# Patient Record
Sex: Male | Born: 2000 | Race: Black or African American | Hispanic: No | Marital: Single
Health system: Southern US, Community
[De-identification: ages and names within clinical notes are randomized; demographics above are authoritative.]

## PROBLEM LIST (undated history)

## (undated) DIAGNOSIS — F909 Attention-deficit hyperactivity disorder, unspecified type: Secondary | ICD-10-CM

## (undated) DIAGNOSIS — J302 Other seasonal allergic rhinitis: Secondary | ICD-10-CM

---
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
## 2020-03-28 ENCOUNTER — Emergency Department (HOSPITAL_COMMUNITY)
Admission: EM | Admit: 2020-03-28 | Discharge: 2020-03-28 | Disposition: A | Discharge status: Home / Self Care | Payer: Medicaid Other | Attending: Emergency Medicine | Admitting: Emergency Medicine

## 2020-03-28 ENCOUNTER — Encounter (HOSPITAL_COMMUNITY): Payer: Self-pay | Admitting: Emergency Medicine

## 2020-03-28 DIAGNOSIS — R21 Rash and other nonspecific skin eruption: Secondary | ICD-10-CM | POA: Diagnosis present

## 2020-03-28 DIAGNOSIS — R5381 Other malaise: Secondary | ICD-10-CM | POA: Diagnosis not present

## 2020-03-28 DIAGNOSIS — L509 Urticaria, unspecified: Secondary | ICD-10-CM | POA: Diagnosis not present

## 2020-03-28 DIAGNOSIS — Z7722 Contact with and (suspected) exposure to environmental tobacco smoke (acute) (chronic): Secondary | ICD-10-CM | POA: Diagnosis not present

## 2020-03-28 MED ORDER — SODIUM CHLORIDE 0.9 % IV BOLUS: 500.0000 mL | Freq: Once | INTRAVENOUS | Status: DC

## 2020-03-28 MED ORDER — DIPHENHYDRAMINE HCL 25 MG PO CAPS
25.0000 mg | ORAL_CAPSULE | Freq: Once | ORAL | Status: AC
  Administered 2020-03-28: 25 mg via ORAL
  Filled 2020-03-28: qty 1

## 2020-03-28 MED ORDER — FAMOTIDINE 20 MG PO TABS
20.0000 mg | ORAL_TABLET | Freq: Once | ORAL | Status: AC
  Administered 2020-03-28: 20 mg via ORAL
  Filled 2020-03-28: qty 1

## 2020-03-28 NOTE — ED Triage Notes (Signed)
Pt arrives to ED with chief complaint of rash- hx of herpes and out of medications for 3 weeks and now having diffuse herpes outbreak.

## 2020-03-28 NOTE — Discharge Instructions (Addendum)
The rash you are having is called urticaria.  This is usually an allergic reaction.  We recommend that you use famotidine 20 mg twice a day, and diphenhydramine 25 mg 4 times a day, to treat rash or itching.  If you are not better in 1 week, see your doctor.

## 2020-03-28 NOTE — ED Notes (Signed)
Pt requesting bus pass. 

## 2020-03-28 NOTE — ED Notes (Signed)
Pt provided with taxi voucher.

## 2020-03-28 NOTE — ED Notes (Signed)
Remain the patient to ask for a work note

## 2020-03-28 NOTE — ED Provider Notes (Signed)
[facility] EMERGENCY DEPARTMENT Provider Note   CSN: [id number] Arrival date & time: 03/28/20  1749     History Chief Complaint  Patient presents with  . Rash    Jose Gallagher is a 20 y.o. male.  HPI He complains of itchy rash for several days, and is worried about having a herpes outbreak.  He has previously been treated for rash with Valtrex.  He denies chest pain, shortness of breath, weakness or dizziness.  There is been no fever, chills, nausea or vomiting.  There are no other known modifying factors.    Past Medical History:  Diagnosis Date  . ADHD (attention deficit hyperactivity disorder)   . Seasonal allergies     There are no problems to display for this patient.   History reviewed. No pertinent surgical history.     Family History  Problem Relation Age of Onset  . Drug abuse Mother     Social History   Tobacco Use  . Smoking status: Passive Smoke Exposure - Never Smoker  . Smokeless tobacco: Never Used  Substance Use Topics  . Drug use: Yes    Types: Marijuana    Home Medications Prior to Admission medications   Medication Sig Start Date End Date Taking? Authorizing Provider  ibuprofen (ADVIL) 600 MG tablet Take 1 tablet (600 mg total) by mouth every 6 (six) hours as needed. 10/31/19   Fayrene Helper, PA-C  lisdexamfetamine (VYVANSE) 50 MG capsule Take 50 mg by mouth daily.    [provider]  penicillin v potassium (VEETID) 500 MG tablet Take 1 tablet (500 mg total) by mouth 3 (three) times daily. 10/31/19   Fayrene Helper, PA-C  traZODone (DESYREL) 150 MG tablet Take 150 mg by mouth at bedtime.     [provider]    Allergies    Patient has no known allergies.  Review of Systems   Review of Systems  All other systems reviewed and are negative.   Physical Exam Updated Vital Signs BP 119/69 (BP Location: Left Arm)   Pulse 78   Temp 98.4 F (36.9 C)   Resp 14   SpO2 99%   Physical Exam Vitals and  nursing note reviewed.  Constitutional:      Appearance: He is well-developed.  HENT:     Head: Normocephalic and atraumatic.     Right Ear: External ear normal.     Left Ear: External ear normal.     Mouth/Throat:     Mouth: Mucous membranes are moist.     Pharynx: No oropharyngeal exudate or posterior oropharyngeal erythema.  Eyes:     Conjunctiva/sclera: Conjunctivae normal.     Pupils: Pupils are equal, round, and reactive to light.  Neck:     Trachea: Phonation normal.  Cardiovascular:     Rate and Rhythm: Normal rate.  Pulmonary:     Effort: Pulmonary effort is normal.  Abdominal:     General: There is no distension.     Palpations: Abdomen is soft.  Musculoskeletal:        General: Normal range of motion.     Cervical back: Normal range of motion and neck supple.  Skin:    General: Skin is warm and dry.     Comments: Mild scattered urticarial rash on face, neck, right arm and both legs.  No areas of petechiae or vesicles  Neurological:     Mental Status: He is alert and oriented to person, place, and time.  Cranial Nerves: No cranial nerve deficit.     Sensory: No sensory deficit.     Motor: No abnormal muscle tone.     Coordination: Coordination normal.  Psychiatric:        Mood and Affect: Mood normal.        Behavior: Behavior normal.        Thought Content: Thought content normal.        Judgment: Judgment normal.     ED Results / Procedures / Treatments   Labs (all labs ordered are listed, but only abnormal results are displayed) Labs Reviewed - No data to display  EKG None  Radiology No results found.  Procedures Procedures   Medications Ordered in ED Medications  diphenhydrAMINE (BENADRYL) capsule 25 mg (has no administration in time range)  famotidine (PEPCID) tablet 20 mg (has no administration in time range)    ED Course  I have reviewed the triage vital signs and the nursing notes.  Pertinent labs & imaging results that were  available during my care of the patient were reviewed by me and considered in my medical decision making (see chart for details).    MDM Rules/Calculators/A&P                           Patient Vitals for the past 24 hrs:  BP Temp Pulse Resp SpO2  03/28/20 1751 119/69 98.4 F (36.9 C) 78 14 99 %    10:48 PM Reevaluation with update and discussion. After initial assessment and treatment, an updated evaluation reveals no change in clinical status, findings discussed and questions answered. Mancel Bale   Medical Decision Making:  This patient is presenting for evaluation of rash, which does not require a range of treatment options, and is not a complaint that involves a high risk of morbidity and mortality. The differential diagnoses include drug allergy, seasonal allergy, urticaria. I decided to review old records, and in summary healthy young male with history of herpes presenting with rash that does not appear like herpes.  I do not require additional historical information from anyone.  Critical Interventions-clinical evaluation, discussion with patient  After These Interventions, the Patient was reevaluated and was found for discharge, with treatment for urticaria.  No worrisome signs for herpes, drug allergy or systemic allergic reaction  CRITICAL CARE-no Performed by: Mancel Bale  Nursing Notes Reviewed/ Care Coordinated Applicable Imaging Reviewed Interpretation of Laboratory Data incorporated into ED treatment  The patient appears reasonably screened and/or stabilized for discharge and I doubt any other medical condition or other [facility] requiring further screening, evaluation, or treatment in the ED at this time prior to discharge.  Plan: Home Medications-OTC medication for urticaria; Home Treatments-rest, fluids; return here if the recommended treatment, does not improve the symptoms; Recommended follow up-PCP, as needed     Final Clinical Impression(s) / ED  Diagnoses Final diagnoses:  Urticaria    Rx / DC Orders ED Discharge Orders    None       Mancel Bale, MD 03/29/20 1028

## 2020-03-28 NOTE — ED Notes (Signed)
Patient verbalized understanding of discharge instructions. Opportunity for questions and answers.  

## 2020-06-14 ENCOUNTER — Telehealth: Payer: Self-pay

## 2020-06-14 NOTE — Telephone Encounter (Signed)
Patient has appt with Jose Gallagher 6/8 at 1:50 Provider is working virtual. Reached out to patient to make aware appt is virtual but no answer. A voicemail was left appt has been changed but if an in person is preferred to call [phone] to reschedule.

## 2020-06-15 ENCOUNTER — Telehealth: Payer: Self-pay | Admitting: Nurse Practitioner

## 2020-06-15 ENCOUNTER — Other Ambulatory Visit: Payer: Self-pay

## 2020-06-15 ENCOUNTER — Ambulatory Visit: Payer: Medicaid Other | Attending: Nurse Practitioner | Admitting: Nurse Practitioner

## 2020-06-15 NOTE — Telephone Encounter (Signed)
No answer. LVM to return call to office. Will attempt to call back in 5-10 min.

## 2020-06-15 NOTE — Telephone Encounter (Signed)
NO answer. LVM to return call to office for televisit

## 2020-09-10 ENCOUNTER — Encounter (HOSPITAL_COMMUNITY): Payer: Self-pay

## 2020-09-10 ENCOUNTER — Emergency Department (HOSPITAL_COMMUNITY)
Admission: EM | Admit: 2020-09-10 | Discharge: 2020-09-10 | Disposition: A | Discharge status: Home / Self Care | Payer: Medicaid Other | Attending: Emergency Medicine | Admitting: Emergency Medicine

## 2020-09-10 DIAGNOSIS — M79605 Pain in left leg: Secondary | ICD-10-CM | POA: Diagnosis not present

## 2020-09-10 DIAGNOSIS — Z7722 Contact with and (suspected) exposure to environmental tobacco smoke (acute) (chronic): Secondary | ICD-10-CM | POA: Insufficient documentation

## 2020-09-10 DIAGNOSIS — W19XXXA Unspecified fall, initial encounter: Secondary | ICD-10-CM | POA: Diagnosis not present

## 2020-09-10 DIAGNOSIS — R252 Cramp and spasm: Secondary | ICD-10-CM | POA: Diagnosis not present

## 2020-09-10 MED ORDER — ACETAMINOPHEN 500 MG PO TABS
1000.0000 mg | ORAL_TABLET | Freq: Once | ORAL | Status: AC
  Administered 2020-09-10: 1000 mg via ORAL
  Filled 2020-09-10: qty 2

## 2020-09-10 NOTE — Discharge Instructions (Addendum)
Drink plenty of fluid and rest.

## 2020-09-10 NOTE — ED Triage Notes (Signed)
Pt BIB [facility] for eval of leg pain. States long walk today. Further along in triage process states "I don't really even need to be here, I just need a ride home".

## 2020-09-10 NOTE — ED Provider Notes (Addendum)
[facility] EMERGENCY DEPARTMENT Provider Note   CSN: [id number] Arrival date & time: 09/10/20  1644     History No chief complaint on file.   Jose Gallagher is a 20 y.o. male.  The history is provided by the patient. No language interpreter was used.    20 year old male brought here via EMS for evaluation of leg cramps.  Pt sts today he was walking for over an hr and report having bilteral leg cramps.  He couldn't hardly walk and called EMS.  Now that he is here in the ER and resting he feels better.  He is now requesting for transportation to get home.  Pt denies fever, cp, sob, back pain.  No other complaint  Past Medical History:  Diagnosis Date   ADHD (attention deficit hyperactivity disorder)    Seasonal allergies     There are no problems to display for this patient.   No past surgical history on file.     Family History  Problem Relation Age of Onset   Drug abuse Mother     Social History   Tobacco Use   Smoking status: Passive Smoke Exposure - Never Smoker   Smokeless tobacco: Never  Substance Use Topics   Drug use: Yes    Types: Marijuana    Home Medications Prior to Admission medications   Medication Sig Start Date End Date Taking? Authorizing Provider  ibuprofen (ADVIL) 600 MG tablet Take 1 tablet (600 mg total) by mouth every 6 (six) hours as needed. 10/31/19   Fayrene Helper, PA-C  lisdexamfetamine (VYVANSE) 50 MG capsule Take 50 mg by mouth daily.    [provider]  penicillin v potassium (VEETID) 500 MG tablet Take 1 tablet (500 mg total) by mouth 3 (three) times daily. 10/31/19   Fayrene Helper, PA-C  traZODone (DESYREL) 150 MG tablet Take 150 mg by mouth at bedtime.     [provider]    Allergies    Patient has no known allergies.  Review of Systems   Review of Systems  All other systems reviewed and are negative.  Physical Exam Updated Vital Signs BP 122/69   Pulse 67   Temp 98.4 F (36.9 C)   Resp  18   Ht 5\' 8"  (1.727 m)   Wt 57 kg   SpO2 98%   BMI 19.11 kg/m   Physical Exam Vitals and nursing note reviewed.  Constitutional:      General: He is not in acute distress.    Appearance: He is well-developed.  HENT:     Head: Atraumatic.  Eyes:     Conjunctiva/sclera: Conjunctivae normal.  Cardiovascular:     Rate and Rhythm: Normal rate and regular rhythm.     Pulses: Normal pulses.     Heart sounds: Normal heart sounds.  Pulmonary:     Effort: Pulmonary effort is normal.     Breath sounds: Normal breath sounds.  Abdominal:     Palpations: Abdomen is soft.  Musculoskeletal:        General: Tenderness (mild tenderness to bilateral calves without swelling noted.  able to ambulate) present.     Cervical back: Neck supple.  Skin:    Findings: No rash.  Neurological:     Mental Status: He is alert.  Psychiatric:        Mood and Affect: Mood normal.    ED Results / Procedures / Treatments   Labs (all labs ordered are listed, but only  abnormal results are displayed) Labs Reviewed - No data to display  EKG None  Radiology No results found.  Procedures Procedures   Medications Ordered in ED Medications - No data to display  ED Course  I have reviewed the triage vital signs and the nursing notes.  Pertinent labs & imaging results that were available during my care of the patient were reviewed by me and considered in my medical decision making (see chart for details).    MDM Rules/Calculators/A&P                           BP 122/69   Pulse 67   Temp 98.4 F (36.9 C)   Resp 18   Ht 5\' 8"  (1.727 m)   Wt 57 kg   SpO2 98%   BMI 19.11 kg/m   Final Clinical Impression(s) / ED Diagnoses Final diagnoses:  Leg cramps    Rx / DC Orders ED Discharge Orders     None      4:48 PM Pt report legs cramping from walking for more than an hr.  Legs felt better after waiting in the waiting room, now requesting transportation to get home.  No concerning  finding on exam.  Pt stable for discharge.    , PA-C 09/10/20 1649    11/10/20, PA-C 09/10/20 1655    11/10/20, MD 09/11/20 1136

## 2021-03-01 DIAGNOSIS — T161XXA Foreign body in right ear, initial encounter: Secondary | ICD-10-CM | POA: Diagnosis not present

## 2021-03-01 DIAGNOSIS — S00451A Superficial foreign body of right ear, initial encounter: Secondary | ICD-10-CM | POA: Diagnosis not present

## 2021-03-01 DIAGNOSIS — Z760 Encounter for issue of repeat prescription: Secondary | ICD-10-CM | POA: Diagnosis not present

## 2021-09-01 ENCOUNTER — Telehealth: Payer: Medicaid Other | Admitting: Family Medicine

## 2021-09-01 ENCOUNTER — Telehealth: Payer: Medicaid Other | Admitting: Physician Assistant

## 2021-09-01 DIAGNOSIS — K047 Periapical abscess without sinus: Secondary | ICD-10-CM

## 2021-09-01 MED ORDER — IBUPROFEN 600 MG PO TABS: 600.0000 mg | ORAL_TABLET | Freq: Four times a day (QID) | ORAL | 0 refills | Status: AC | PRN

## 2021-09-01 MED ORDER — PENICILLIN V POTASSIUM 500 MG PO TABS: 500.0000 mg | ORAL_TABLET | Freq: Three times a day (TID) | ORAL | 0 refills | Status: AC

## 2021-09-01 NOTE — Progress Notes (Signed)
The patient no-showed for appointment despite this provider sending direct link x 2 with no response and waiting for at least 10 minutes from appointment time for patient to join. They will be marked as a NS for this appointment/time.   Jesiah Yerby M Denym Christenberry, PA-C    

## 2021-09-01 NOTE — Patient Instructions (Signed)
Dental Abscess  A dental abscess is an infection around a tooth that may involve pain, swelling, and a collection of pus, as well as other symptoms. Treatment is important to help with symptoms and to prevent the infection from spreading. The general types of dental abscesses are: Pulpal abscess. This abscess may form from the inner part of the tooth (pulp). Periodontal abscess. This abscess may form from the gum. What are the causes? This condition is caused by a bacterial infection in or around the tooth. It may result from: Severe tooth decay (cavities). Trauma to the tooth, such as a broken or chipped tooth. What increases the risk? This condition is more likely to develop in males. It is also more likely to develop in people who: Have cavities. Have severe gum disease. Eat sugary snacks between meals. Use tobacco products. Have diabetes. Have a weakened disease-fighting system (immune system). Do not brush and care for their teeth regularly. What are the signs or symptoms? Mild symptoms of this condition include: Tenderness. Bad breath. Fever. A bitter taste in the mouth. Pain in and around the infected tooth. Moderate symptoms of this condition include: Swollen neck glands. Chills. Pus drainage. Swelling and redness around the infected tooth, in the mouth, or in the face. Severe pain in and around the infected tooth. Severe symptoms of this condition include: Difficulty swallowing. Difficulty opening the mouth. Nausea. Vomiting. How is this diagnosed? This condition is diagnosed based on: Your symptoms and your medical and dental history. An examination of the infected tooth. During the exam, your dental care provider may tap on the infected tooth. You may also need to have X-rays taken of the affected area. How is this treated? This condition is treated by getting rid of the infection. This may be done with: Antibiotic medicines. These may be used in certain  situations. Antibacterial mouth rinse. Incision and drainage. This procedure is done by making an incision in the abscess to drain out the pus. Removing pus is the first priority in treating an abscess. A root canal. This may be performed to save the tooth. Your dental care provider accesses the visible part of your tooth (crown) with a drill and removes any infected pulp. Then the space is filled and sealed off. Tooth extraction. The tooth is pulled out if it cannot be saved by other treatment. You may also receive treatment for pain, such as: Acetaminophen or NSAIDs. Gels that contain a numbing medicine. An injection to block the pain near your nerve. Follow these instructions at home: Medicines Take over-the-counter and prescription medicines only as told by your dental care provider. If you were prescribed an antibiotic, take it as told by your dental care provider. Do not stop taking the antibiotic even if you start to feel better. If you were prescribed a gel that contains a numbing medicine, use it exactly as told in the directions. Do not use these gels for children who are younger than 2 years of age. Use an antibacterial mouth rinse as told by your dental care provider. General instructions  Gargle with a mixture of salt and water 3-4 times a day or as needed. To make salt water, completely dissolve -1 tsp (3-6 g) of salt in 1 cup (237 mL) of warm water. Eat a soft diet while your abscess is healing. Drink enough fluid to keep your urine pale yellow. Do not apply heat to the outside of your mouth. Do not use any products that contain nicotine or tobacco. These   products include cigarettes, chewing tobacco, and vaping devices, such as e-cigarettes. If you need help quitting, ask your dental care provider. Keep all follow-up visits. This is important. How is this prevented?  Excellent dental home care, which includes brushing your teeth every morning and night with fluoride  toothpaste. Floss one time each day. Get regularly scheduled dental cleanings. Consider having a dental sealant applied on teeth that have deep grooves to prevent cavities. Drink fluoridated water regularly. This includes most tap water. Check the label on bottled water to see if it contains fluoride. Reduce or eliminate sugary drinks. Eat healthy meals and snacks. Wear a mouth guard or face shield to protect your teeth while playing sports. Contact a health care provider if: Your pain is worse and is not helped by medicine. You have swelling. You see pus around the tooth. You have a fever or chills. Get help right away if: Your symptoms suddenly get worse. You have a very bad headache. You have problems breathing or swallowing. You have trouble opening your mouth. You have swelling in your neck or around your eye. These symptoms may represent a serious problem that is an emergency. Do not wait to see if the symptoms will go away. Get medical help right away. Call your local emergency services (911 in the [place] Do not drive yourself to the hospital. Summary A dental abscess is a collection of pus in or around a tooth that results from an infection. A dental abscess may result from severe tooth decay, trauma to the tooth, or severe gum disease around a tooth. Symptoms include severe pain, swelling, redness, and drainage of pus in and around the infected tooth. The first priority in treating a dental abscess is to drain out the pus. Treatment may also involve removing damage inside the tooth (root canal) or extracting the tooth. This information is not intended to replace advice given to you by your health care provider. Make sure you discuss any questions you have with your health care provider. Document Revised: 03/03/2020 Document Reviewed: 03/03/2020 Elsevier Patient Education  2023 [employer].  

## 2021-09-01 NOTE — Progress Notes (Signed)
Virtual Visit Consent   Jose Gallagher, you are scheduled for a virtual visit with a [facility] provider today. Just as with appointments in the office, your consent must be obtained to participate. Your consent will be active for this visit and any virtual visit you may have with one of our providers in the next 365 days. If you have a MyChart account, a copy of this consent can be sent to you electronically.  As this is a virtual visit, video technology does not allow for your provider to perform a traditional examination. This may limit your provider's ability to fully assess your condition. If your provider identifies any concerns that need to be evaluated in person or the need to arrange testing (such as labs, EKG, etc.), we will make arrangements to do so. Although advances in technology are sophisticated, we cannot ensure that it will always work on either your end or our end. If the connection with a video visit is poor, the visit may have to be switched to a telephone visit. With either a video or telephone visit, we are not always able to ensure that we have a secure connection.  By engaging in this virtual visit, you consent to the provision of healthcare and authorize for your insurance to be billed (if applicable) for the services provided during this visit. Depending on your insurance coverage, you may receive a charge related to this service.  I need to obtain your verbal consent now. Are you willing to proceed with your visit today? Barbie Banner has provided verbal consent on 09/01/2021 for a virtual visit (video or telephone). [name], FNP  Date: 09/01/2021 12:34 PM  Virtual Visit via Video Note   I, [name], connected with  Jose Gallagher  ([id number], 2000-01-30) on 09/01/21 at 12:15 PM EDT by a video-enabled telemedicine application and verified that I am speaking with the correct person using two identifiers.  Location: Patient: Virtual Visit Location Patient:  Home Provider: Virtual Visit Location Provider: Home Office   I discussed the limitations of evaluation and management by telemedicine and the availability of in person appointments. The patient expressed understanding and agreed to proceed.    History of Present Illness: Jose Gallagher is a 21 y.o. who identifies as a male who was assigned male at birth, and is being seen today for dental pain. He and his mother are on call. She says his teeth are rotting. He has had 4 removed on the other side and now is sched to remove the one affected now in a few weeks. [facility] No [facility]. [name]  HPI: HPI  Problems: There are no problems to display for this patient.   Allergies: No Known Allergies Medications:  Current Outpatient Medications:    ibuprofen (ADVIL) 600 MG tablet, Take 1 tablet (600 mg total) by mouth every 6 (six) hours as needed., Disp: 30 tablet, Rfl: 0   lisdexamfetamine (VYVANSE) 50 MG capsule, Take 50 mg by mouth daily., Disp: , Rfl:    penicillin v potassium (VEETID) 500 MG tablet, Take 1 tablet (500 mg total) by mouth 3 (three) times daily., Disp: 30 tablet, Rfl: 0   traZODone (DESYREL) 150 MG tablet, Take 150 mg by mouth at bedtime. , Disp: , Rfl:   Observations/Objective: Patient is  in no acute distress. .  No labored breathing.  Speech is clear and coherent with logical content.  Patient is alert and oriented at baseline.  He is on  telephone visit with mother.   Assessment and Plan: 1. Dental abscess  Warm salt water rinses. Med use and side effects discussed. Urgent care or dentist follow up for worsening sx.   Follow Up Instructions: I discussed the assessment and treatment plan with the patient. The patient was provided an opportunity to ask questions and all were answered. The patient agreed with the plan and demonstrated an understanding of the instructions.  A copy of instructions were sent to the patient via MyChart unless otherwise noted below.      The patient was advised to call back or seek an in-person evaluation if the symptoms worsen or if the condition fails to improve as anticipated.  Time:  I spent 15 minutes with the patient via telephone technology discussing the above problems/concerns.    [name], FNP

## 2021-09-01 NOTE — Progress Notes (Signed)
Pt no showed for OV x 2. Neither phone number in chart are working numbers. DWB

## 2021-09-08 DIAGNOSIS — Z419 Encounter for procedure for purposes other than remedying health state, unspecified: Secondary | ICD-10-CM | POA: Diagnosis not present

## 2021-10-08 DIAGNOSIS — Z419 Encounter for procedure for purposes other than remedying health state, unspecified: Secondary | ICD-10-CM | POA: Diagnosis not present

## 2021-11-08 DIAGNOSIS — Z419 Encounter for procedure for purposes other than remedying health state, unspecified: Secondary | ICD-10-CM | POA: Diagnosis not present

## 2021-11-09 ENCOUNTER — Telehealth: Payer: Self-pay | Admitting: Pediatrics

## 2021-11-09 NOTE — Telephone Encounter (Signed)
Copied from [place] [id number]. Topic: Appointment Scheduling - Scheduling Inquiry for Clinic >> Nov 09, 2021  9:32 AM Ludger Nutting wrote: Patient's mother called to schedule a new patient appointment with Dr. Joya Gaskins. No appointments available. Please follow up with patient's mother.

## 2021-11-09 NOTE — Telephone Encounter (Signed)
error 

## 2021-12-08 DIAGNOSIS — Z419 Encounter for procedure for purposes other than remedying health state, unspecified: Secondary | ICD-10-CM | POA: Diagnosis not present

## 2021-12-13 ENCOUNTER — Ambulatory Visit: Payer: Medicaid Other | Admitting: Critical Care Medicine

## 2021-12-13 NOTE — Progress Notes (Deleted)
   New Patient Office Visit  Subjective    Patient ID: Jose Gallagher, male    DOB: 08/14/2000  Age: 21 y.o. MRN: [id number]  CC: No chief complaint on file.   HPI Jose Gallagher presents to establish care ***  Outpatient Encounter Medications as of 12/13/2021  Medication Sig   ibuprofen (ADVIL) 600 MG tablet Take 1 tablet (600 mg total) by mouth every 6 (six) hours as needed.   lisdexamfetamine (VYVANSE) 50 MG capsule Take 50 mg by mouth daily.   penicillin v potassium (VEETID) 500 MG tablet Take 1 tablet (500 mg total) by mouth 3 (three) times daily.   traZODone (DESYREL) 150 MG tablet Take 150 mg by mouth at bedtime.    No facility-administered encounter medications on file as of 12/13/2021.    Past Medical History:  Diagnosis Date   ADHD (attention deficit hyperactivity disorder)    Seasonal allergies     No past surgical history on file.  Family History  Problem Relation Age of Onset   Drug abuse Mother     Social History   Socioeconomic History   Marital status: Single    Spouse name: Not on file   Number of children: Not on file   Years of education: Not on file   Highest education level: Not on file  Occupational History   Not on file  Tobacco Use   Smoking status: Passive Smoke Exposure - Never Smoker   Smokeless tobacco: Never  Substance and Sexual Activity   Alcohol use: Not on file   Drug use: Yes    Types: Marijuana   Sexual activity: Not on file  Other Topics Concern   Not on file  Social History Narrative   Not on file   Social Determinants of Health   Financial Resource Strain: Not on file  Food Insecurity: Not on file  Transportation Needs: Not on file  Physical Activity: Not on file  Stress: Not on file  Social Connections: Not on file  Intimate Partner Violence: Not on file    ROS      Objective    There were no vitals taken for this visit.  Physical Exam  {Labs (Optional):23779}    Assessment & Plan:   Problem List  Items Addressed This Visit   None   No follow-ups on file.   Shan Levans, MD

## 2022-01-08 DIAGNOSIS — Z419 Encounter for procedure for purposes other than remedying health state, unspecified: Secondary | ICD-10-CM | POA: Diagnosis not present

## 2022-02-08 DIAGNOSIS — Z419 Encounter for procedure for purposes other than remedying health state, unspecified: Secondary | ICD-10-CM | POA: Diagnosis not present

## 2022-03-09 DIAGNOSIS — Z419 Encounter for procedure for purposes other than remedying health state, unspecified: Secondary | ICD-10-CM | POA: Diagnosis not present

## 2022-04-09 DIAGNOSIS — Z419 Encounter for procedure for purposes other than remedying health state, unspecified: Secondary | ICD-10-CM | POA: Diagnosis not present

## 2022-04-17 DIAGNOSIS — Z202 Contact with and (suspected) exposure to infections with a predominantly sexual mode of transmission: Secondary | ICD-10-CM | POA: Diagnosis not present

## 2022-04-17 DIAGNOSIS — R300 Dysuria: Secondary | ICD-10-CM | POA: Diagnosis not present

## 2022-05-09 DIAGNOSIS — Z419 Encounter for procedure for purposes other than remedying health state, unspecified: Secondary | ICD-10-CM | POA: Diagnosis not present

## 2022-06-09 DIAGNOSIS — Z419 Encounter for procedure for purposes other than remedying health state, unspecified: Secondary | ICD-10-CM | POA: Diagnosis not present

## 2022-06-14 ENCOUNTER — Ambulatory Visit: Payer: Medicaid Other | Admitting: Critical Care Medicine

## 2022-06-14 NOTE — Progress Notes (Deleted)
   New Patient Office Visit  Subjective    Patient ID: Jose Gallagher, male    DOB: 2000-06-05  Age: 22 y.o. MRN: [id number]  CC: No chief complaint on file.   HPI Jose Gallagher presents to establish care ***  Outpatient Encounter Medications as of 06/14/2022  Medication Sig   ibuprofen (ADVIL) 600 MG tablet Take 1 tablet (600 mg total) by mouth every 6 (six) hours as needed.   lisdexamfetamine (VYVANSE) 50 MG capsule Take 50 mg by mouth daily.   penicillin v potassium (VEETID) 500 MG tablet Take 1 tablet (500 mg total) by mouth 3 (three) times daily.   traZODone (DESYREL) 150 MG tablet Take 150 mg by mouth at bedtime.    No facility-administered encounter medications on file as of 06/14/2022.    Past Medical History:  Diagnosis Date   ADHD (attention deficit hyperactivity disorder)    Seasonal allergies     No past surgical history on file.  Family History  Problem Relation Age of Onset   Drug abuse Mother     Social History   Socioeconomic History   Marital status: Single    Spouse name: Not on file   Number of children: Not on file   Years of education: Not on file   Highest education level: Not on file  Occupational History   Not on file  Tobacco Use   Smoking status: Passive Smoke Exposure - Never Smoker   Smokeless tobacco: Never  Substance and Sexual Activity   Alcohol use: Not on file   Drug use: Yes    Types: Marijuana   Sexual activity: Not on file  Other Topics Concern   Not on file  Social History Narrative   Not on file   Social Determinants of Health   Financial Resource Strain: Not on file  Food Insecurity: Not on file  Transportation Needs: Not on file  Physical Activity: Not on file  Stress: Not on file  Social Connections: Not on file  Intimate Partner Violence: Not on file    ROS      Objective    There were no vitals taken for this visit.  Physical Exam  {Labs (Optional):23779}    Assessment & Plan:   Problem List  Items Addressed This Visit   None   No follow-ups on file.   Shan Levans, MD

## 2022-07-09 DIAGNOSIS — Z419 Encounter for procedure for purposes other than remedying health state, unspecified: Secondary | ICD-10-CM | POA: Diagnosis not present

## 2022-08-09 DIAGNOSIS — Z419 Encounter for procedure for purposes other than remedying health state, unspecified: Secondary | ICD-10-CM | POA: Diagnosis not present

## 2022-09-09 DIAGNOSIS — Z419 Encounter for procedure for purposes other than remedying health state, unspecified: Secondary | ICD-10-CM | POA: Diagnosis not present

## 2022-10-09 DIAGNOSIS — Z419 Encounter for procedure for purposes other than remedying health state, unspecified: Secondary | ICD-10-CM | POA: Diagnosis not present

## 2022-11-09 DIAGNOSIS — Z419 Encounter for procedure for purposes other than remedying health state, unspecified: Secondary | ICD-10-CM | POA: Diagnosis not present

## 2022-12-09 DIAGNOSIS — Z419 Encounter for procedure for purposes other than remedying health state, unspecified: Secondary | ICD-10-CM | POA: Diagnosis not present

## 2023-01-09 DIAGNOSIS — Z419 Encounter for procedure for purposes other than remedying health state, unspecified: Secondary | ICD-10-CM | POA: Diagnosis not present

## 2023-02-09 DIAGNOSIS — Z419 Encounter for procedure for purposes other than remedying health state, unspecified: Secondary | ICD-10-CM | POA: Diagnosis not present

## 2024-02-12 ENCOUNTER — Other Ambulatory Visit (HOSPITAL_COMMUNITY): Payer: Self-pay
